# Patient Record
Sex: Male | Born: 1980 | Race: Black or African American | Hispanic: No | Marital: Single | Smoking: Never smoker
Health system: Southern US, Community
[De-identification: ages and names within clinical notes are randomized; demographics above are authoritative.]

## PROBLEM LIST (undated history)

## (undated) DIAGNOSIS — D869 Sarcoidosis, unspecified: Secondary | ICD-10-CM

---

## 2006-04-06 DIAGNOSIS — D869 Sarcoidosis, unspecified: Secondary | ICD-10-CM

## 2006-04-06 HISTORY — DX: Sarcoidosis, unspecified: D86.9

## 2018-07-02 ENCOUNTER — Emergency Department (HOSPITAL_COMMUNITY)
Admission: EM | Admit: 2018-07-02 | Discharge: 2018-07-02 | Disposition: A | Discharge status: Home / Self Care | Payer: Self-pay | Attending: Emergency Medicine | Admitting: Emergency Medicine

## 2018-07-02 ENCOUNTER — Emergency Department (HOSPITAL_COMMUNITY): Payer: Self-pay

## 2018-07-02 ENCOUNTER — Encounter (HOSPITAL_COMMUNITY): Payer: Self-pay | Admitting: Emergency Medicine

## 2018-07-02 DIAGNOSIS — M25511 Pain in right shoulder: Secondary | ICD-10-CM

## 2018-07-02 HISTORY — DX: Sarcoidosis, unspecified: D86.9

## 2018-07-02 MED ORDER — ACETAMINOPHEN 325 MG PO TABS
650.0000 mg | ORAL_TABLET | Freq: Once | ORAL | Status: AC
  Administered 2018-07-02: 650 mg via ORAL
  Filled 2018-07-02: qty 2

## 2018-07-02 MED ORDER — LIDOCAINE 5 % EX PTCH
1.0000 | MEDICATED_PATCH | CUTANEOUS | Status: DC
  Administered 2018-07-02: 1 via TRANSDERMAL
  Filled 2018-07-02: qty 1

## 2018-07-02 NOTE — ED Notes (Signed)
Patient verbalizes understanding of discharge instructions . Opportunity for questions and answers were provided . Armband removed by staff ,Pt discharged from ED. W/C  offered at D/C  and Declined W/C at D/C and was escorted to lobby by RN.  

## 2018-07-02 NOTE — ED Provider Notes (Signed)
[facility] EMERGENCY DEPARTMENT Provider Note   CSN: [id number] Arrival date & time: 07/02/18  1313    History   Chief Complaint Chief Complaint  Patient presents with  . Shoulder Pain    HPI Nathaniel Moyer is a 38 y.o. male presenting today for right shoulder pain.  Patient reports that 3 days ago he was at work lifting boxes when he felt a "tweak" in his right shoulder.  Patient states that he has had gradually worsening pain of the right shoulder since that time describes a sharp throb moderate intensity constant worsened with movement of the right shoulder and relieved with rest.  Patient denies any other concerns today no history of fever/chills, cough, chest chest pain/shortness of breath, abdominal pain, nausea/vomiting, neck pain, fall or any other injury.  He denies numbness weakness or tingling of the extremities.     HPI  Past Medical History:  Diagnosis Date  . Sarcoidosis 2008    There are no active problems to display for this patient.   History reviewed. No pertinent surgical history.      Home Medications    Prior to Admission medications   Not on File    Family History History reviewed. No pertinent family history.  Social History Social History   Tobacco Use  . Smoking status: Never Smoker  . Smokeless tobacco: Never Used  Substance Use Topics  . Alcohol use: Not Currently    Comment: occ  . Drug use: Yes    Types: Marijuana     Allergies   Patient has no allergy information on record.   Review of Systems Review of Systems  Constitutional: Negative.  Negative for chills, fatigue and fever.  Respiratory: Negative.  Negative for cough and shortness of breath.   Cardiovascular: Negative.  Negative for chest pain.  Musculoskeletal: Positive for arthralgias (Right shoulder pain). Negative for back pain, myalgias, neck pain and neck stiffness.  Neurological: Negative.  Negative for weakness, numbness and headaches.    Physical Exam Updated Vital Signs BP (!) 126/94 (BP Location: Left Arm)   Pulse 99   Temp 98 F (36.7 C) (Oral)   Resp 17   SpO2 100%   Physical Exam Constitutional:      General: He is not in acute distress.    Appearance: Normal appearance. He is well-developed. He is not ill-appearing or diaphoretic.  HENT:     Head: Normocephalic and atraumatic.     Right Ear: External ear normal.     Left Ear: External ear normal.     Nose: Nose normal.  Eyes:     General: Vision grossly intact. Gaze aligned appropriately.     Pupils: Pupils are equal, round, and reactive to light.  Neck:     Musculoskeletal: Normal range of motion.     Trachea: Trachea and phonation normal. No tracheal deviation.  Cardiovascular:     Rate and Rhythm: Normal rate and regular rhythm.     Pulses:          Radial pulses are 2+ on the right side and 2+ on the left side.  Pulmonary:     Effort: Pulmonary effort is normal. No respiratory distress.  Abdominal:     General: There is no distension.     Palpations: Abdomen is soft.     Tenderness: There is no abdominal tenderness. There is no guarding or rebound.  Musculoskeletal: Normal range of motion.     Comments: Cervical Spine: Appearance normal. No obvious  bony deformity. No skin swelling, erythema, heat, fluctuance or break of the skin. No TTP over the cervical spinous processes. No paraspinal tenderness. No step-offs. Patient is able to actively rotate their neck 45 degrees left and right voluntarily without pain and flex and extend the neck without pain.   Right Shoulder: Appearance normal. No obvious bony deformity. No skin swelling, erythema, heat, fluctuance or break of the skin. No clavicular deformity or TTP. TTP over anterior deltoid.  Patient is able to actively resist range of motion for abduction, abduction, flexion extension and internal/external rotation of the shoulder.  He has decreased range of motion and increased pain when raising above  shoulder level.  Right Elbow: Appearance normal. No obvious bony deformity. No skin swelling, erythema, heat, fluctuance or break of the skin. No TTP over joint. Active flexion, extension, supination and pronation full and intact without pain. Strength able and appropriate for age for flexion and extension.   Radial Pulse 2+. Cap refill <2 seconds. SILT for M/U/R distributions. Compartments soft.  Patient with strong and equal grip strength bilaterally.  Full range of motion and strength of bilateral wrists, elbows.  Skin:    General: Skin is warm and dry.  Neurological:     Mental Status: He is alert.     GCS: GCS eye subscore is 4. GCS verbal subscore is 5. GCS motor subscore is 6.     Comments: Speech is clear and goal oriented, follows commands Major Cranial nerves without deficit, no facial droop Moves extremities without ataxia, coordination intact Normal gait  Psychiatric:        Behavior: Behavior normal.      ED Treatments / Results  Labs (all labs ordered are listed, but only abnormal results are displayed) Labs Reviewed - No data to display  EKG None  Radiology Dg Shoulder Right  Result Date: 07/02/2018 CLINICAL DATA:  Pain. EXAM: RIGHT SHOULDER - 2+ VIEW COMPARISON:  None. FINDINGS: There is no evidence of fracture or dislocation. There is no evidence of arthropathy or other focal bone abnormality. Soft tissues are unremarkable. IMPRESSION: Negative. Electronically Signed   By: Gerome Sam III M.D   On: 07/02/2018 14:25    Procedures Procedures (including critical care time)  Medications Ordered in ED Medications  lidocaine (LIDODERM) 5 % 1 patch (1 patch Transdermal Patch Applied 07/02/18 1345)  acetaminophen (TYLENOL) tablet 650 mg (650 mg Oral Given 07/02/18 1345)     Initial Impression / Assessment and Plan / ED Course  I have reviewed the triage vital signs and the nursing notes.  Pertinent labs & imaging results that were available during my care of  the patient were reviewed by me and considered in my medical decision making (see chart for details).    Nathaniel Moyer was evaluated in Emergency Department on 07/02/2018 for the symptoms described in the history of present illness. He was evaluated in the context of the global COVID-19 pandemic, which necessitated consideration that the patient might be at risk for infection with the SARS-CoV-2 virus that causes COVID-19. Institutional protocols and algorithms that pertain to the evaluation of patients at risk for COVID-19 are in a state of rapid change based on information released by regulatory bodies including the CDC and federal and state organizations. These policies and algorithms were followed during the patient's care in the ED.  Patient is without recent fever, cough, shortness of breath, travel out of state or exposure to known novel coronavirus patients.  Novel coronavirus testing is not  indicated at this time. ----------------- Patient presents with right shoulder pain after injury picking a box at work. Extremity neurovascularly intact; no signs of infection, septic joint, DVT, compartment syndrome.  Patient with slightly decreased range of motion particularly above the shoulder secondary to pain.  Resisted range of motion with appropriate strength.  Capillary refill and sensation intact to all fingers.  No radicular symptoms or neck pain.  Grip strength full and equal bilaterally.    DG Right Shoulder: IMPRESSION: Negative.  Patient informed of x-ray findings today.  He has been informed that ligament, tendon or muscular injury is still possible and that further evaluation by orthopedist is indicated.  He has been given a sling today for comfort and educated on swelling increasing range of motion as tolerated to avoid frozen shoulder.  At this time there does not appear to be any evidence of an acute emergency medical condition and the patient appears stable for discharge with appropriate  outpatient follow up. Diagnosis was discussed with patient who verbalizes understanding of care plan and is agreeable to discharge. I have discussed return precautions with patient who verbalizes understanding of return precautions. Patient encouraged to follow-up with their PCP. All questions answered.  Patient has been discharged in good condition.  Patient's case discussed with Dr. Lockie Mola who agrees with plan to discharge with follow-up.   Note: Portions of this report may have been transcribed using voice recognition software. Every effort was made to ensure accuracy; however, inadvertent computerized transcription errors may still be present. Final Clinical Impressions(s) / ED Diagnoses   Final diagnoses:  Acute pain of right shoulder    ED Discharge Orders    None       Elizabeth Palau 07/02/18 1513    Virgina Norfolk, DO 07/02/18 1645

## 2018-07-02 NOTE — Discharge Instructions (Signed)
You have been diagnosed today with right shoulder pain.  At this time there does not appear to be the presence of an emergent medical condition, however there is always the potential for conditions to change. Please read and follow the below instructions.  Please return to the Emergency Department immediately for any new or worsening symptoms. Please be sure to follow up with your Primary Care Provider within one week regarding your visit today; please call their office to schedule an appointment even if you are feeling better for a follow-up visit. Your x-ray today did not show any fractures or dislocations however it is likely that you have a ligament, tendon or muscular injury of your right shoulder.  Please use the sling given you today to help with your pain.  Starting tomorrow please slowly increase your range of motion as tolerable without pain of your right shoulder to avoid frozen shoulder.  Please call Dr. Carola Frost the orthopedic specialist on your discharge paperwork today to schedule a follow-up for your right shoulder pain as further evaluation may be necessary.  Get help right away if: Your arm, hand, or fingers: Tingle. Are numb. Are swollen. Are painful. Turn white or blue. Get help right away if: You have chest pain/shortness of breath You have fever Any new or worsening symptoms.  Please read the additional information packets attached to your discharge summary.

## 2018-07-02 NOTE — ED Triage Notes (Signed)
Pt reports he "tweaked" his right shoulder last Saturday at work, reports the pain has been increasing and he is unable to move it now.

## 2018-08-19 ENCOUNTER — Other Ambulatory Visit: Payer: Self-pay | Admitting: Orthopedic Surgery

## 2018-08-19 DIAGNOSIS — M75101 Unspecified rotator cuff tear or rupture of right shoulder, not specified as traumatic: Secondary | ICD-10-CM

## 2018-09-02 ENCOUNTER — Inpatient Hospital Stay: Admission: RE | Admit: 2018-09-02 | Payer: Self-pay | Source: Ambulatory Visit

## 2018-09-02 ENCOUNTER — Other Ambulatory Visit: Payer: Self-pay

## 2020-04-11 IMAGING — DX RIGHT SHOULDER - 2+ VIEW
2 series · 2 of 2 positions shown · non-contrast
Comparison: None.

CLINICAL DATA: Pain.

EXAM:
RIGHT SHOULDER - 2+ VIEW

[w shoulder external right]
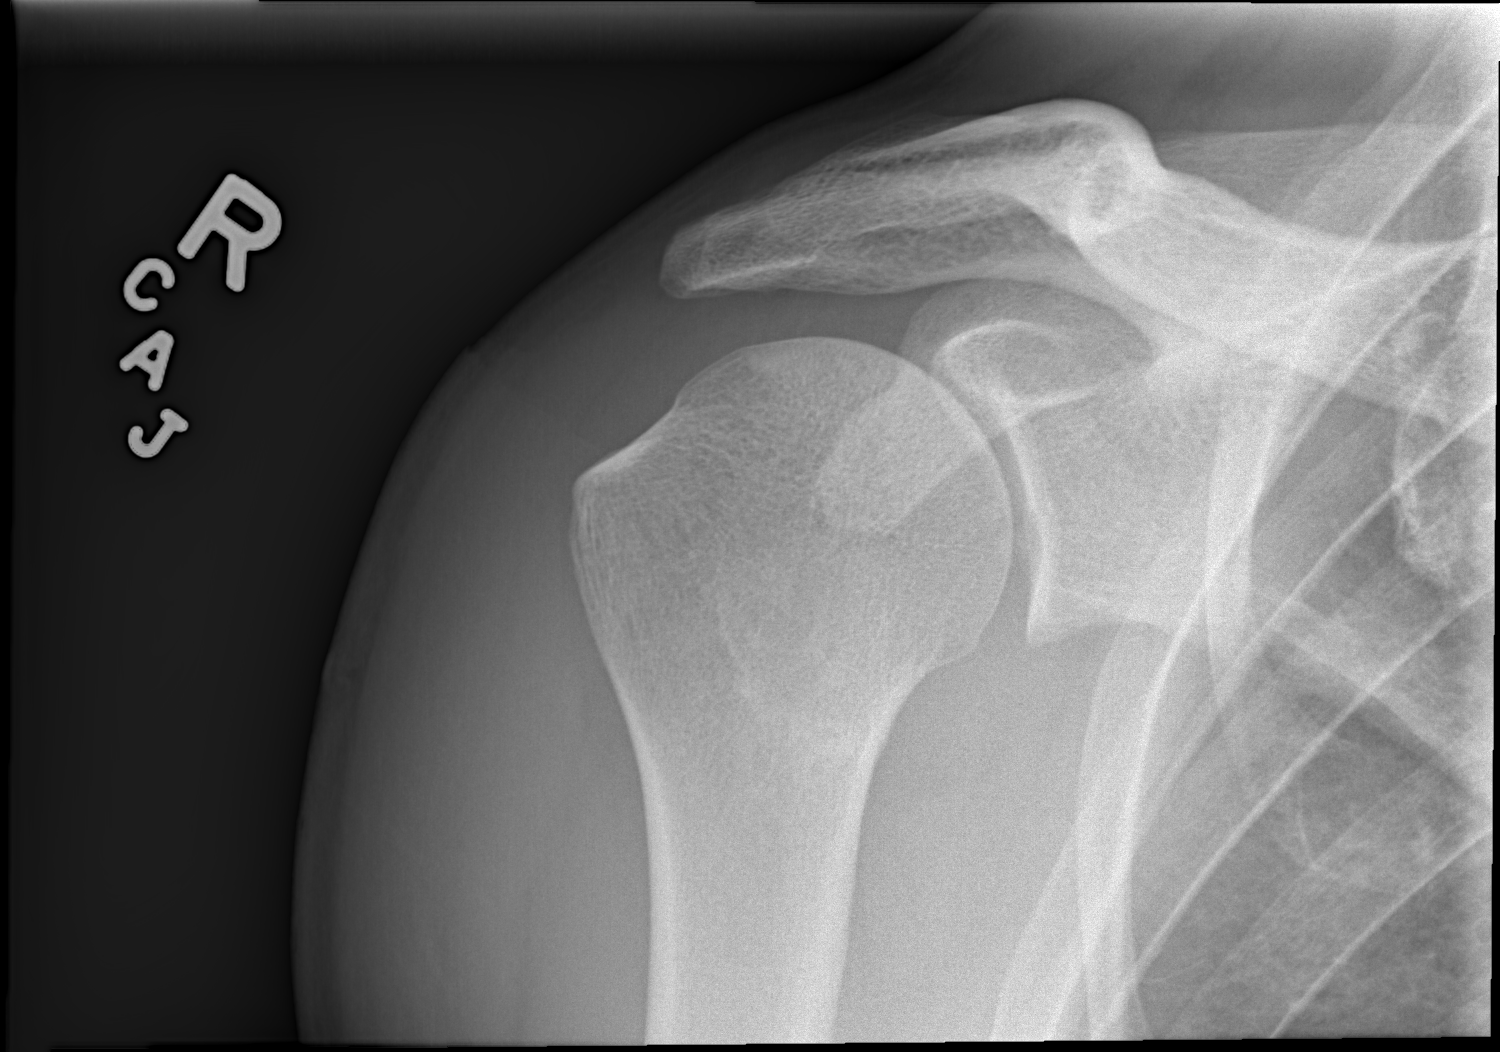

[w shoulder y-view right]
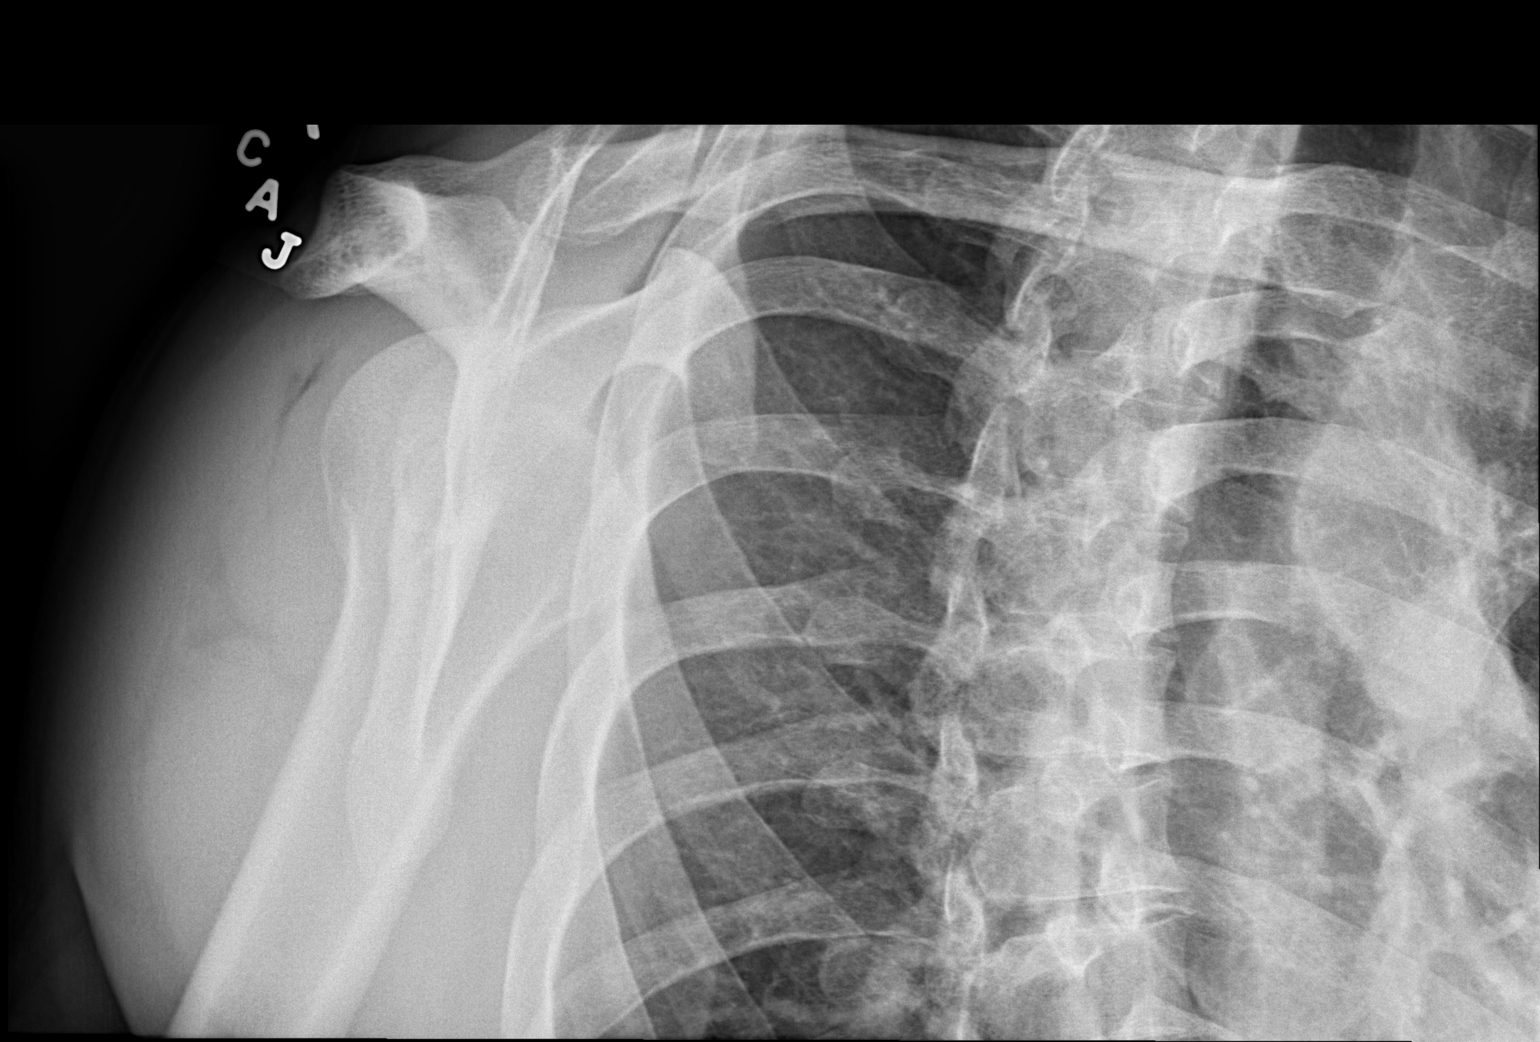

[2 of 2 positions shown; findings below may reference images not displayed]

FINDINGS: There is no evidence of fracture or dislocation. There is no
evidence of arthropathy or other focal bone abnormality. Soft
tissues are unremarkable.
IMPRESSION: Negative.

## 2023-04-01 ENCOUNTER — Emergency Department (HOSPITAL_COMMUNITY): Payer: Managed Care, Other (non HMO)

## 2023-04-01 ENCOUNTER — Emergency Department (HOSPITAL_COMMUNITY)
Admission: EM | Admit: 2023-04-01 | Discharge: 2023-04-02 | Discharge status: Against Medical Advice | Payer: Managed Care, Other (non HMO) | Attending: Emergency Medicine | Admitting: Emergency Medicine

## 2023-04-01 DIAGNOSIS — M546 Pain in thoracic spine: Secondary | ICD-10-CM | POA: Insufficient documentation

## 2023-04-01 DIAGNOSIS — M545 Low back pain, unspecified: Secondary | ICD-10-CM | POA: Insufficient documentation

## 2023-04-01 DIAGNOSIS — Z5321 Procedure and treatment not carried out due to patient leaving prior to being seen by health care provider: Secondary | ICD-10-CM | POA: Diagnosis not present

## 2023-04-01 DIAGNOSIS — M25521 Pain in right elbow: Secondary | ICD-10-CM | POA: Diagnosis not present

## 2023-04-01 DIAGNOSIS — Y92410 Unspecified street and highway as the place of occurrence of the external cause: Secondary | ICD-10-CM | POA: Diagnosis not present

## 2023-04-01 NOTE — ED Notes (Signed)
Pt stated they was leaving
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
## 2023-04-21 ENCOUNTER — Ambulatory Visit
Admission: EM | Admit: 2023-04-21 | Discharge: 2023-04-21 | Disposition: A | Discharge status: Home / Self Care | Payer: Managed Care, Other (non HMO) | Attending: Family Medicine | Admitting: Family Medicine

## 2023-04-21 DIAGNOSIS — M5441 Lumbago with sciatica, right side: Secondary | ICD-10-CM | POA: Diagnosis not present

## 2023-04-21 MED ORDER — PREDNISONE 10 MG (21) PO TBPK: ORAL_TABLET | Freq: Every day | ORAL | 0 refills | Status: DC

## 2023-04-21 MED ORDER — NAPROXEN 500 MG PO TABS: 500.0000 mg | ORAL_TABLET | Freq: Two times a day (BID) | ORAL | 0 refills | Status: AC | PRN

## 2023-04-21 MED ORDER — CYCLOBENZAPRINE HCL 10 MG PO TABS: 10.0000 mg | ORAL_TABLET | Freq: Two times a day (BID) | ORAL | 0 refills | Status: AC | PRN

## 2023-04-21 NOTE — Discharge Instructions (Addendum)
 You may take Flexeril  twice daily as needed.  Please note this medication can make you drowsy.  Do not drink alcohol or drive while on this medication.  May start naproxen  twice daily as needed for your back pain.  Take this with food.  Start the prednisone  taper tomorrow, 1/16.  Heat to the back and rest.  Follow-up with your PCP at your scheduled appointment in 2 days for recheck.  Please go to the ER for any worsening symptoms.  I hope you feel better soon!
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
## 2023-04-23 ENCOUNTER — Ambulatory Visit (INDEPENDENT_AMBULATORY_CARE_PROVIDER_SITE_OTHER): Payer: Managed Care, Other (non HMO) | Admitting: Family Medicine

## 2023-04-23 ENCOUNTER — Encounter: Payer: Self-pay | Admitting: Family Medicine

## 2023-04-23 VITALS — BP 130/81 | HR 82 | Ht 71.0 in | Wt 180.0 lb

## 2023-04-23 DIAGNOSIS — M5442 Lumbago with sciatica, left side: Secondary | ICD-10-CM | POA: Diagnosis not present

## 2023-04-23 DIAGNOSIS — Z1211 Encounter for screening for malignant neoplasm of colon: Secondary | ICD-10-CM

## 2023-04-23 NOTE — Patient Instructions (Signed)
 Thank you for choosing [facility] for your Primary Care needs. I am excited for the opportunity to partner with you to meet your health care goals. It was a pleasure meeting you today!  Information on diet, exercise, and health maintenance recommendations are listed below. This is information to help you be sure you are on track for optimal health and monitoring.   Please look over this and let us know if you have any questions or if you have completed any of the health maintenance outside of [facility] so that we can be sure your records are up to date.  ___________________________________________________________  MyChart:  For all urgent or time sensitive needs we ask that you please call the office to avoid delays. Our number is [phone]. MyChart is not constantly monitored and due to the large volume of messages a day, replies may take up to 72 business hours.  MyChart Policy: MyChart allows for you to see your visit notes, after visit summary, provider recommendations, lab and tests results, make an appointment, request refills, and contact your provider or the office for non-urgent questions or concerns. Providers are seeing patients during normal business hours and do not have built in time to review MyChart messages.  We ask that you allow a minimum of 3 business days for responses to [employer]. For this reason, please do not send urgent requests through MyChart. Please call the office at [phone]. New and ongoing conditions may require a visit. We have virtual and in-person visits available for your convenience.  Complex MyChart concerns may require a visit. Your provider may request you schedule a virtual or in-person visit to ensure we are providing the best care possible. MyChart messages sent after 11:00 AM on Friday may not be received by the provider until Monday morning.    Lab and Test Results: You will receive your lab and test  results on MyChart as soon as they are completed and results have been sent by the lab or testing facility. Due to this service, you will receive your results BEFORE your provider.  I review lab and test results each morning prior to seeing patients. Some results require collaboration with other providers to ensure you are receiving the most appropriate care. For this reason, we ask that you please allow a minimum of 3-5 business days from the time that ALL results have been received for your provider to receive and review lab and test results and contact you about these.  Most lab and test result comments from the provider will be sent through MyChart. Your provider may recommend changes to the plan of care, follow-up visits, repeat testing, ask questions, or request an office visit to discuss these results. You may reply directly to this message or call the office to provide information for the provider or set up an appointment. In some instances, you will be called with test results and recommendations. Please let us know if this is preferred and we will make note of this in your chart to provide this for you.    If you have not heard a response to your lab or test results in 5 business days from all results returning to MyChart, please call the office to let us know. We ask that you please avoid calling prior to this time unless there is an emergent concern. Due to high call volumes, this can delay the resulting process.  After Hours: For all non-emergency after hours needs, please  call the office at [phone] and select the option to reach the on-call  service. On-call services are shared between multiple [place] offices and therefore it will not be possible to speak directly with your provider. On-call providers may provide medical advice and recommendations, but are unable to provide refills for maintenance medications.  For all emergency or urgent medical needs after normal business hours, we  recommend that you seek care at the closest Urgent Care or Emergency Department to ensure appropriate treatment in a timely manner.  [facility] has a 24 hour emergency room located on the ground floor for your convenience.   Urgent Concerns During the Business Day Providers are seeing patients from 8AM to 5PM with a busy schedule and are most often not able to respond to non-urgent calls until the end of the day or the next business day. If you should have URGENT concerns during the day, please call and speak to the nurse or schedule a same day appointment so that we can address your concern without delay.   Thank you, again, for choosing me as your health care partner. I appreciate your trust and look forward to learning more about you!   Lollie Marrow Reola Calkins, DNP, FNP-C  ___________________________________________________________  Health Maintenance Recommendations Screening Testing Mammogram Every 1-2 years based on history and risk factors Starting at age 89 Pap Smear Ages 21-39 every 3 years Ages 67-65 every 5 years with HPV testing More frequent testing may be required based on results and history Colon Cancer Screening Every 1-10 years based on test performed, risk factors, and history Starting at age 108 Bone Density Screening Every 2-10 years based on history Starting at age 80 for women Recommendations for men differ based on medication usage, history, and risk factors AAA Screening One time ultrasound Men 52-68 years old who have ever smoked Lung Cancer Screening Low Dose Lung CT every 12 months Age 29-80 years with a 20 pack-year smoking history who still smoke or who have quit within the last 15 years  Screening Labs Routine  Labs: Complete Blood Count (CBC), Complete Metabolic Panel (CMP), Cholesterol (Lipid Panel) Every 6-12 months based on history and medications May be recommended more frequently based on current conditions or previous results Hemoglobin  A1c Lab Every 3-12 months based on history and previous results Starting at age 1 or earlier with diagnosis of diabetes, high cholesterol, BMI >26, and/or risk factors Frequent monitoring for patients with diabetes to ensure blood sugar control Thyroid Panel  Every 6 months based on history, symptoms, and risk factors May be repeated more often if on medication HIV One time testing for all patients 92 and older May be repeated more frequently for patients with increased risk factors or exposure Hepatitis C One time testing for all patients 19 and older May be repeated more frequently for patients with increased risk factors or exposure Gonorrhea, Chlamydia Every 12 months for all sexually active persons 13-24 years Additional monitoring may be recommended for those who are considered high risk or who have symptoms PSA Men 34-21 years old with risk factors Additional screening may be recommended from age 37-69 based on risk factors, symptoms, and history  Vaccine Recommendations Tetanus Booster All adults every 10 years Flu Vaccine All patients 6 months and older every year COVID Vaccine All patients 12 years and older Initial dosing with booster May recommend additional booster based on age and health history HPV Vaccine 2 doses all patients age 3-26 Dosing may be considered  for patients over 26 Shingles Vaccine (Shingrix) 2 doses all adults 50 years and older Pneumonia (Pneumovax 23) All adults 65 years and older May recommend earlier dosing based on health history Pneumonia (Prevnar 60) All adults 65 years and older Dosed 1 year after Pneumovax 23 Pneumonia (Prevnar 20) All adults 65 years and older (adults 19-64 with certain conditions or risk factors) 1 dose  For those who have not received Prevnar 13 vaccine previously   Additional Screening, Testing, and Vaccinations may be recommended on an individualized basis based on family history, health history, risk  factors, and/or exposure.  __________________________________________________________  Diet Recommendations for All Patients  I recommend that all patients maintain a diet low in saturated fats, carbohydrates, and cholesterol. While this can be challenging at first, it is not impossible and small changes can make big differences.  Things to try: Decreasing the amount of soda, sweet tea, and/or juice to one or less per day and replace with water While water is always the first choice, if you do not like water you may consider adding a water additive without sugar to improve the taste other sugar free drinks Replace potatoes with a brightly colored vegetable  Use healthy oils, such as canola oil or olive oil, instead of butter or hard margarine Limit your bread intake to two pieces or less a day Replace regular pasta with low carb pasta options Bake, broil, or grill foods instead of frying Monitor portion sizes  Eat smaller, more frequent meals throughout the day instead of large meals  An important thing to remember is, if you love foods that are not great for your health, you don't have to give them up completely. Instead, allow these foods to be a reward when you have done well. Allowing yourself to still have special treats every once in a while is a nice way to tell yourself thank you for working hard to keep yourself healthy.   Also remember that every day is a new day. If you have a bad day and "fall off the wagon", you can still climb right back up and keep moving along on your journey!  We have resources available to help you!  Some websites that may be helpful include: www.http://www.wall-[employer].info/  Www.VeryWellFit.com _____________________________________________________________  Activity Recommendations for All Patients  I recommend that all adults get at least 30 minutes of moderate physical activity that elevates your heart rate at least 5 days out of the week.  Some examples  include: Walking or jogging at a pace that allows you to carry on a conversation Cycling (stationary bike or outdoors) Water aerobics Yoga Weight lifting Dancing If physical limitations prevent you from putting stress on your joints, exercise in a pool or seated in a chair are excellent options.  Do determine your MAXIMUM heart rate for activity: 220 - YOUR AGE = MAX Heart Rate   Remember! Do not push yourself too hard.  Start slowly and build up your pace, speed, weight, time in exercise, etc.  Allow your body to rest between exercise and get good sleep. You will need more water than normal when you are exerting yourself. Do not wait until you are thirsty to drink. Drink with a purpose of getting in at least 8, 8 ounce glasses of water a day plus more depending on how much you exercise and sweat.    If you begin to develop dizziness, chest pain, abdominal pain, jaw pain, shortness of breath, headache, vision changes, lightheadedness, or other concerning symptoms,  stop the activity and allow your body to rest. If your symptoms are severe, seek emergency evaluation immediately. If your symptoms are concerning, but not severe, please let us know so that we can recommend further evaluation.
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
## 2023-04-28 ENCOUNTER — Ambulatory Visit (INDEPENDENT_AMBULATORY_CARE_PROVIDER_SITE_OTHER): Payer: Managed Care, Other (non HMO) | Admitting: Family Medicine

## 2023-04-28 VITALS — BP 115/71 | HR 66 | Ht 71.0 in | Wt 184.0 lb

## 2023-04-28 DIAGNOSIS — Z0000 Encounter for general adult medical examination without abnormal findings: Secondary | ICD-10-CM | POA: Diagnosis not present

## 2023-04-28 DIAGNOSIS — D72819 Decreased white blood cell count, unspecified: Secondary | ICD-10-CM | POA: Diagnosis not present

## 2023-04-28 DIAGNOSIS — Z1159 Encounter for screening for other viral diseases: Secondary | ICD-10-CM

## 2023-04-28 DIAGNOSIS — Z114 Encounter for screening for human immunodeficiency virus [HIV]: Secondary | ICD-10-CM

## 2023-04-28 NOTE — Progress Notes (Signed)
Complete physical exam  Patient: Nathaniel Moyer   DOB: May 29, 1980   43 y.o. Male  MRN: [id number]  Subjective:    Chief Complaint  Patient presents with   Annual Exam    Nathaniel Moyer is a 43 y.o. male who presents today for a complete physical exam. He reports consuming a general diet.  He usually works out 3-4x/week.  He generally feels well. He reports sleeping well. He does not have additional problems to discuss today.   Currently lives with: wife and children Acute concerns or interim problems since last visit: no  Vision concerns: no concerns Dental concerns: no concerns   ETOH use: occasionally  Nicotine use: no Recreational drugs/illegal substances: marijuana, daily     Most recent fall risk assessment:    04/23/2023   10:46 AM  Fall Risk   Falls in the past year? 0  Number falls in past yr: 0  Injury with Fall? 0  Risk for fall due to : No Fall Risks  Follow up Falls evaluation completed     Most recent depression screenings:    04/23/2023   10:46 AM  PHQ 2/9 Scores  PHQ - 2 Score 0            Patient Care Team: Clayborne Dana, NP as PCP - General (Family Medicine)   Outpatient Medications Prior to Visit  Medication Sig   cyclobenzaprine (FLEXERIL) 10 MG tablet Take 1 tablet (10 mg total) by mouth 2 (two) times daily as needed for muscle spasms.   naproxen (NAPROSYN) 500 MG tablet Take 1 tablet (500 mg total) by mouth 2 (two) times daily as needed (back pain).   predniSONE (STERAPRED UNI-PAK 21 TAB) 10 MG (21) TBPK tablet Take by mouth daily. Take 6 tabs by mouth daily  for 1 day, then 5 tabs for 1 day, then 4 tabs for 1 day, then 3 tabs for 1 day, 2 tabs for 1 day, then 1 tab by mouth daily for 1 days   No facility-administered medications prior to visit.    ROS All review of systems negative except what is listed in the HPI        Objective:     BP 115/71   Pulse 66   Ht 5\' 11"  (1.803 m)   Wt 184 lb (83.5 kg)   SpO2 99%   BMI  25.66 kg/m    Physical Exam Vitals reviewed.  Constitutional:      General: He is not in acute distress.    Appearance: Normal appearance. He is not ill-appearing.  HENT:     Head: Normocephalic and atraumatic.     Right Ear: Tympanic membrane normal.     Left Ear: Tympanic membrane normal.     Nose: Nose normal.     Mouth/Throat:     Mouth: Mucous membranes are moist.     Pharynx: Oropharynx is clear.  Eyes:     Extraocular Movements: Extraocular movements intact.     Conjunctiva/sclera: Conjunctivae normal.     Pupils: Pupils are equal, round, and reactive to light.  Cardiovascular:     Rate and Rhythm: Normal rate and regular rhythm.     Pulses: Normal pulses.     Heart sounds: Normal heart sounds.  Pulmonary:     Effort: Pulmonary effort is normal.     Breath sounds: Normal breath sounds.  Abdominal:     General: Abdomen is flat. Bowel sounds are normal. There is no distension.  Palpations: Abdomen is soft. There is no mass.     Tenderness: There is no abdominal tenderness. There is no right CVA tenderness, left CVA tenderness, guarding or rebound.  Genitourinary:    Comments: Deferred exam Musculoskeletal:        General: Normal range of motion.     Cervical back: Normal range of motion and neck supple. No tenderness.     Right lower leg: No edema.     Left lower leg: No edema.  Lymphadenopathy:     Cervical: No cervical adenopathy.  Skin:    General: Skin is warm and dry.     Capillary Refill: Capillary refill takes less than 2 seconds.  Neurological:     General: No focal deficit present.     Mental Status: He is alert and oriented to person, place, and time. Mental status is at baseline.  Psychiatric:        Mood and Affect: Mood normal.        Behavior: Behavior normal.        Thought Content: Thought content normal.        Judgment: Judgment normal.      No results found for any visits on 04/28/23.     Assessment & Plan:    Routine Health  Maintenance and Physical Exam Discussed health promotion and safety including diet and exercise recommendations, dental health, and injury prevention. Tobacco cessation if applicable. Seat belts, sunscreen, smoke detectors, etc.    Immunization History  Administered Date(s) Administered   Tdap 12/08/2011    Health Maintenance  Topic Date Due   HIV Screening  Never done   Hepatitis C Screening  Never done   INFLUENZA VACCINE  07/05/2023 (Originally 11/05/2022)   COVID-19 Vaccine (1 - 2024-25 season) 04/05/2024 (Originally 12/06/2022)   DTaP/Tdap/Td (2 - Td or Tdap) 04/22/2024 (Originally 12/07/2021)   HPV VACCINES  Aged Out        Problem List Items Addressed This Visit   None Visit Diagnoses       Annual physical exam    -  Primary   Relevant Orders   CBC with Differential/Platelet   Comprehensive metabolic panel   Lipid panel   TSH   Hepatitis C antibody   HIV Antibody (routine testing w rflx)     Encounter for screening for HIV       Relevant Orders   HIV Antibody (routine testing w rflx)     Encounter for hepatitis C screening test for low risk patient       Relevant Orders   Hepatitis C antibody      PATIENT COUNSELING:    Sexuality: Discussed sexually transmitted diseases, partner selection, use of condoms, avoidance of unintended pregnancy, and contraceptive alternatives.   I discussed with the patient that most people either abstain from alcohol or drink within safe limits (<=14/week and <=4 drinks/occasion for males, <=7/weeks and <= 3 drinks/occasion for females) and that the risk for alcohol disorders and other health effects rises proportionally with the number of drinks per week and how often a drinker exceeds daily limits.  Discussed cessation/primary prevention of drug use and availability of treatment for abuse.   Diet: Encouraged to adjust caloric intake to maintain or achieve ideal body weight, to reduce intake of dietary saturated fat and total fat, to  limit sodium intake by avoiding high sodium foods and not adding table salt, and to maintain adequate dietary potassium and calcium preferably from fresh fruits, vegetables, and low-fat dairy  products. Encouraged vitamin D 1000 units and Calcium 1300mg  or 4 servings of dairy a day.  Emphasized the importance of regular exercise.  Injury prevention: Discussed safety belts, safety helmets, smoke detector, smoking near bedding or upholstery.   Dental health: Discussed importance of regular tooth brushing, flossing, and dental visits.    Return in about 1 year (around 04/27/2024) for physical.     Clayborne Dana, NP

## 2023-04-28 NOTE — Patient Instructions (Signed)

## 2023-04-29 LAB — LIPID PANEL
Cholesterol: 196 mg/dL (ref 0–200)
HDL: 74 mg/dL (ref >39.00)
LDL Cholesterol: 105 mg/dL — ABNORMAL HIGH (ref 0–99)
NonHDL: 121.59
Total CHOL/HDL Ratio: 3
Triglycerides: 84 mg/dL (ref 0.0–149.0)
VLDL: 16.8 mg/dL (ref 0.0–40.0)

## 2023-04-29 LAB — CBC WITH DIFFERENTIAL/PLATELET
Basophils Absolute: 0 10*3/uL (ref 0.0–0.1)
Basophils Relative: 1.3 % (ref 0.0–3.0)
Eosinophils Absolute: 0 10*3/uL (ref 0.0–0.7)
Eosinophils Relative: 0.9 % (ref 0.0–5.0)
HCT: 41.5 % (ref 39.0–52.0)
Hemoglobin: 13.8 g/dL (ref 13.0–17.0)
Lymphocytes Relative: 43.7 % (ref 12.0–46.0)
Lymphs Abs: 1.3 10*3/uL (ref 0.7–4.0)
MCHC: 33.2 g/dL (ref 30.0–36.0)
MCV: 89.7 fL (ref 78.0–100.0)
Monocytes Absolute: 0.3 10*3/uL (ref 0.1–1.0)
Monocytes Relative: 9.4 % (ref 3.0–12.0)
Neutro Abs: 1.4 10*3/uL (ref 1.4–7.7)
Neutrophils Relative %: 44.7 % (ref 43.0–77.0)
Platelets: 240 10*3/uL (ref 150.0–400.0)
RBC: 4.63 Mil/uL (ref 4.22–5.81)
RDW: 13.1 % (ref 11.5–15.5)
WBC: 3 10*3/uL — ABNORMAL LOW (ref 4.0–10.5)

## 2023-04-29 LAB — HEPATITIS C ANTIBODY: Hepatitis C Ab: NONREACTIVE

## 2023-04-29 LAB — COMPREHENSIVE METABOLIC PANEL
ALT: 17 U/L (ref 0–53)
AST: 22 U/L (ref 0–37)
Albumin: 4.8 g/dL (ref 3.5–5.2)
Alkaline Phosphatase: 49 U/L (ref 39–117)
BUN: 16 mg/dL (ref 6–23)
CO2: 27 meq/L (ref 19–32)
Calcium: 9.7 mg/dL (ref 8.4–10.5)
Chloride: 103 meq/L (ref 96–112)
Creatinine, Ser: 1.01 mg/dL (ref 0.40–1.50)
GFR: 90.27 mL/min (ref >60.00)
Glucose, Bld: 92 mg/dL (ref 70–99)
Potassium: 4.4 meq/L (ref 3.5–5.1)
Sodium: 139 meq/L (ref 135–145)
Total Bilirubin: 0.8 mg/dL (ref 0.2–1.2)
Total Protein: 6.8 g/dL (ref 6.0–8.3)

## 2023-04-29 LAB — HIV ANTIBODY (ROUTINE TESTING W REFLEX): HIV 1&2 Ab, 4th Generation: NONREACTIVE

## 2023-04-29 LAB — TSH: TSH: 0.5 u[IU]/mL (ref 0.35–5.50)

## 2023-04-29 NOTE — Addendum Note (Signed)
Addended by: Hyman Hopes B on: 04/29/2023 12:24 PM   Modules accepted: Orders

## 2023-05-04 ENCOUNTER — Ambulatory Visit: Payer: Managed Care, Other (non HMO) | Attending: Family Medicine | Admitting: Physical Therapy

## 2023-05-04 ENCOUNTER — Other Ambulatory Visit: Payer: Self-pay

## 2023-05-04 ENCOUNTER — Encounter: Payer: Self-pay | Admitting: Physical Therapy

## 2023-05-04 DIAGNOSIS — M6281 Muscle weakness (generalized): Secondary | ICD-10-CM | POA: Insufficient documentation

## 2023-05-04 DIAGNOSIS — M5459 Other low back pain: Secondary | ICD-10-CM | POA: Insufficient documentation

## 2023-05-04 DIAGNOSIS — M5442 Lumbago with sciatica, left side: Secondary | ICD-10-CM | POA: Diagnosis not present
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
## 2023-05-05 NOTE — Patient Instructions (Signed)
Access Code: [email or web address] URL: [email or web address] Date: 05/04/2023 Prepared by: Rosana Hoes  Exercises - Supine Lower Trunk Rotation  - 2 x daily - 1-2 sets - 5 reps - 5-10 seconds hold - Hooklying Single Knee to Chest Stretch  - 2 x daily - 1-2 sets - 5 reps - 5-10 seconds hold - Supine Pelvic Tilt  - 2 x daily - 1-2 sets - 10 reps - Seated Pelvic Tilt  - 2 x daily - 1-2 sets - 10 reps - Seated Lumbar Flexion Stretch  - 2 x daily - 1-2 sets - 5 reps - 3-5 seconds hold

## 2023-05-10 ENCOUNTER — Telehealth: Payer: Self-pay | Admitting: Physical Therapy

## 2023-05-10 ENCOUNTER — Encounter: Payer: Managed Care, Other (non HMO) | Admitting: Physical Therapy

## 2023-05-10 NOTE — Telephone Encounter (Signed)
Contacted patient regarding no show to appointment. Pt thought his appointment was tomorrow. He was able to reschedule the appointment for tomorrow.

## 2023-05-11 ENCOUNTER — Ambulatory Visit: Payer: Managed Care, Other (non HMO) | Attending: Family Medicine | Admitting: Physical Therapy

## 2023-05-11 ENCOUNTER — Encounter: Payer: Self-pay | Admitting: Physical Therapy

## 2023-05-11 DIAGNOSIS — M6281 Muscle weakness (generalized): Secondary | ICD-10-CM | POA: Insufficient documentation

## 2023-05-11 DIAGNOSIS — M5459 Other low back pain: Secondary | ICD-10-CM | POA: Insufficient documentation
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
## 2023-05-13 ENCOUNTER — Other Ambulatory Visit: Payer: Self-pay

## 2023-05-13 ENCOUNTER — Ambulatory Visit: Payer: Managed Care, Other (non HMO) | Admitting: Physical Therapy

## 2023-05-13 ENCOUNTER — Encounter: Payer: Self-pay | Admitting: Physical Therapy

## 2023-05-13 DIAGNOSIS — M5459 Other low back pain: Secondary | ICD-10-CM

## 2023-05-13 DIAGNOSIS — M6281 Muscle weakness (generalized): Secondary | ICD-10-CM
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
## 2023-05-18 ENCOUNTER — Ambulatory Visit: Payer: Managed Care, Other (non HMO) | Admitting: Physical Therapy

## 2023-05-18 ENCOUNTER — Encounter: Payer: Self-pay | Admitting: Physical Therapy

## 2023-05-18 ENCOUNTER — Other Ambulatory Visit: Payer: Self-pay

## 2023-05-18 DIAGNOSIS — M6281 Muscle weakness (generalized): Secondary | ICD-10-CM

## 2023-05-18 DIAGNOSIS — M5459 Other low back pain: Secondary | ICD-10-CM | POA: Diagnosis not present
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
## 2023-05-19 NOTE — Patient Instructions (Signed)
Access Code: [email or web address] URL: [email or web address] Date: 05/19/2023 Prepared by: Rosana Hoes  Exercises - Supine Lower Trunk Rotation  - 2 x daily - 1-2 sets - 5 reps - 5-10 seconds hold - Hooklying Single Knee to Chest Stretch  - 2 x daily - 1-2 sets - 5 reps - 5-10 seconds hold - Supine March  - 2 x daily - 1-2 sets - 10 reps - Supine Hamstring Stretch  - 2 x daily - 3 sets - 10 reps - Bent Knee Fallouts  - 2 x daily - 1-2 sets - 10 reps - Supine Pelvic Tilt  - 2 x daily - 1-2 sets - 10 reps - Sidelying Thoracic Lumbar Rotation  - 1-2 x daily - 10 reps - 5 seconds hold - Seated Pelvic Tilt  - 2 x daily - 1-2 sets - 10 reps - Seated Lumbar Flexion Stretch  - 2 x daily - 1-2 sets - 5 reps - 3-5 seconds hold

## 2023-05-20 ENCOUNTER — Other Ambulatory Visit: Payer: Self-pay

## 2023-05-20 ENCOUNTER — Encounter: Payer: Self-pay | Admitting: Physical Therapy

## 2023-05-20 ENCOUNTER — Ambulatory Visit: Payer: Managed Care, Other (non HMO) | Admitting: Physical Therapy

## 2023-05-20 DIAGNOSIS — M6281 Muscle weakness (generalized): Secondary | ICD-10-CM

## 2023-05-20 DIAGNOSIS — M5459 Other low back pain: Secondary | ICD-10-CM | POA: Diagnosis not present
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
## 2023-05-24 ENCOUNTER — Ambulatory Visit: Payer: Managed Care, Other (non HMO) | Admitting: Physical Therapy

## 2023-05-24 ENCOUNTER — Other Ambulatory Visit: Payer: Self-pay

## 2023-05-24 ENCOUNTER — Encounter: Payer: Self-pay | Admitting: Physical Therapy

## 2023-05-24 DIAGNOSIS — M5459 Other low back pain: Secondary | ICD-10-CM | POA: Diagnosis not present

## 2023-05-24 DIAGNOSIS — M6281 Muscle weakness (generalized): Secondary | ICD-10-CM
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
## 2023-05-28 ENCOUNTER — Encounter: Payer: Self-pay | Admitting: Physical Therapy

## 2023-05-28 ENCOUNTER — Ambulatory Visit: Payer: Managed Care, Other (non HMO) | Admitting: Physical Therapy

## 2023-05-28 DIAGNOSIS — M5459 Other low back pain: Secondary | ICD-10-CM | POA: Diagnosis not present

## 2023-05-28 DIAGNOSIS — M6281 Muscle weakness (generalized): Secondary | ICD-10-CM
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
## 2023-05-31 ENCOUNTER — Telehealth: Payer: Self-pay | Admitting: Family Medicine

## 2023-05-31 ENCOUNTER — Ambulatory Visit: Payer: Managed Care, Other (non HMO) | Admitting: Physical Therapy

## 2023-05-31 NOTE — Telephone Encounter (Signed)
 Pt dropped off document to be filled out by provider (FMLA with 14 pages) Pt would like document to be faxed in when ready to Fax # [phone] and to call pt so pt can be aware when document was faxed in. (Pt tel [phone]) Document put at front office tray under providers name.

## 2023-06-01 NOTE — Telephone Encounter (Signed)
 Request appointment so we can discuss details of what he is asking for [place] to put on forms.

## 2023-06-01 NOTE — Telephone Encounter (Signed)
 Placed in provider basket - don't see where this was discussed?

## 2023-06-01 NOTE — Telephone Encounter (Signed)
 LVM for patient to call back to schedule an appt with Ladona Ridgel to discuss. When he returns call please schedule. Thanks!

## 2023-06-02 ENCOUNTER — Ambulatory Visit: Payer: Managed Care, Other (non HMO) | Admitting: Physical Therapy

## 2023-06-02 ENCOUNTER — Encounter: Payer: Self-pay | Admitting: Physical Therapy

## 2023-06-02 DIAGNOSIS — M5459 Other low back pain: Secondary | ICD-10-CM | POA: Diagnosis not present

## 2023-06-02 DIAGNOSIS — M6281 Muscle weakness (generalized): Secondary | ICD-10-CM
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
## 2023-06-03 ENCOUNTER — Ambulatory Visit (INDEPENDENT_AMBULATORY_CARE_PROVIDER_SITE_OTHER): Payer: Managed Care, Other (non HMO) | Admitting: Family Medicine

## 2023-06-03 ENCOUNTER — Encounter: Payer: Self-pay | Admitting: Family Medicine

## 2023-06-03 VITALS — BP 129/76 | HR 78 | Ht 71.0 in | Wt 176.0 lb

## 2023-06-03 DIAGNOSIS — M25572 Pain in left ankle and joints of left foot: Secondary | ICD-10-CM | POA: Diagnosis not present

## 2023-06-03 DIAGNOSIS — M5442 Lumbago with sciatica, left side: Secondary | ICD-10-CM | POA: Diagnosis not present

## 2023-06-03 NOTE — Progress Notes (Signed)
 Acute Office Visit  Subjective:     Patient ID: Nathaniel Moyer, male    DOB: [birth year], 43 y.o.   MRN: [id number]  Chief Complaint  Patient presents with   Medical Management of Chronic Issues    HPI Patient is in today for back/ankle pain.    Discussed the use of AI scribe software for clinical note transcription with the patient, who gave verbal consent to proceed.  History of Present Illness Nathaniel Moyer is a 43 year old male who presents with an ankle injury after falling down the stairs.  He fell down the stairs at home yesterday, resulting in a significant ankle injury. The ankle twisted awkwardly, rolling both inward and outward. Immediate swelling occurred, raising concerns about a fracture, although he was able to stand on it, which reassured him that it might not be broken. He has been managing the swelling with ice and warm water soaks with Epsom salt, which has significantly reduced the swelling. He can bear weight on the ankle and move it, although it remains stiff due to the swelling. The ankle is still swollen but has not developed any bruising.  He experiences some pain, particularly when pressure is applied to the swollen areas. He has been using compression to manage the swelling and is considering using an ankle brace for additional support. He has been taking ibuprofen and Tylenol for pain management.  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 He is concerned about his ability to return to work and is planning to take today/tomorrow off and return on Monday.  He is currently attending physical therapy for an unrelated back issue and is considering intermittent leave for back pain management. His job is requiring we back-date FMLA for the few days he missed in January for this.       ROS All review of systems negative except what is listed in the HPI      Objective:    BP 129/76   Pulse 78   Ht 5\' 11"  (1.803 m)   Wt 176 lb (79.8 kg)    SpO2 99%   BMI 24.55 kg/m    Physical Exam Vitals reviewed.  Constitutional:      Appearance: Normal appearance.  Musculoskeletal:     Comments: Left ankle with mild/mod edema, no bruising, medial abrasion ("rug burn"), passive ROM slightly limited due to pain/swelling; full weight bearing  Neurological:     Mental Status: He is alert.  Psychiatric:        Mood and Affect: Mood normal.        Behavior: Behavior normal.        Thought Content: Thought content normal.        Judgment: Judgment normal.     No results found for any visits on 06/03/23.      Assessment & Plan:   Problem List Items Addressed This Visit   None Visit Diagnoses       Acute left ankle pain    -  Primary     Acute bilateral low back pain with left-sided sciatica          Assessment & Plan Ankle Sprain Patient fell down stairs and twisted ankle. Swelling present, but able to bear weight. No bruising noted. -Continue RICE (Rest, Ice, Compression, Elevation) protocol. -Use ankle brace for support. -If no significant improvement in a week, consider X-ray. -Return to work on 06/07/2023 with ankle brace.  Intermittent Back Pain Patient has history of intermittent  back pain requiring occasional time off work. -Continue physical therapy. -Provide intermittent leave for two episodes per month, two days per episode.    No orders of the defined types were placed in this encounter.   Return if symptoms worsen or fail to improve.  Clayborne Dana, NP

## 2023-06-08 ENCOUNTER — Other Ambulatory Visit: Payer: Managed Care, Other (non HMO)

## 2023-06-17 ENCOUNTER — Other Ambulatory Visit: Payer: Self-pay

## 2023-06-17 ENCOUNTER — Ambulatory Visit: Payer: Managed Care, Other (non HMO) | Attending: Family Medicine | Admitting: Physical Therapy

## 2023-06-17 ENCOUNTER — Encounter: Payer: Self-pay | Admitting: Physical Therapy

## 2023-06-17 DIAGNOSIS — M6281 Muscle weakness (generalized): Secondary | ICD-10-CM | POA: Insufficient documentation

## 2023-06-17 DIAGNOSIS — M5459 Other low back pain: Secondary | ICD-10-CM | POA: Diagnosis present
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
## 2023-06-21 ENCOUNTER — Other Ambulatory Visit: Payer: Self-pay

## 2023-06-21 ENCOUNTER — Encounter: Payer: Self-pay | Admitting: Physical Therapy

## 2023-06-21 ENCOUNTER — Ambulatory Visit: Payer: Managed Care, Other (non HMO) | Admitting: Physical Therapy

## 2023-06-21 DIAGNOSIS — M6281 Muscle weakness (generalized): Secondary | ICD-10-CM

## 2023-06-21 DIAGNOSIS — M5459 Other low back pain: Secondary | ICD-10-CM | POA: Diagnosis not present
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
## 2023-06-28 ENCOUNTER — Ambulatory Visit: Payer: Managed Care, Other (non HMO) | Admitting: Physical Therapy

## 2023-07-08 ENCOUNTER — Encounter: Payer: Self-pay | Admitting: Family Medicine

## 2023-07-26 ENCOUNTER — Ambulatory Visit: Attending: Family Medicine

## 2023-07-26 DIAGNOSIS — M6281 Muscle weakness (generalized): Secondary | ICD-10-CM | POA: Insufficient documentation

## 2023-07-26 DIAGNOSIS — M5459 Other low back pain: Secondary | ICD-10-CM | POA: Diagnosis present
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
